# Patient Record
Sex: Male | Born: 1995
Health system: Southern US, Community
[De-identification: ages and names within clinical notes are randomized; demographics above are authoritative.]

---
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
## 2017-03-11 DIAGNOSIS — Q213 Tetralogy of Fallot: Secondary | ICD-10-CM | POA: Diagnosis not present

## 2017-04-26 DIAGNOSIS — Z0000 Encounter for general adult medical examination without abnormal findings: Secondary | ICD-10-CM | POA: Diagnosis not present

## 2017-04-26 DIAGNOSIS — Q210 Ventricular septal defect: Secondary | ICD-10-CM | POA: Diagnosis not present

## 2017-11-03 DIAGNOSIS — J069 Acute upper respiratory infection, unspecified: Secondary | ICD-10-CM | POA: Diagnosis not present

## 2017-11-03 DIAGNOSIS — R21 Rash and other nonspecific skin eruption: Secondary | ICD-10-CM | POA: Diagnosis not present

## 2017-11-03 DIAGNOSIS — R05 Cough: Secondary | ICD-10-CM | POA: Diagnosis not present

## 2018-02-09 DIAGNOSIS — R509 Fever, unspecified: Secondary | ICD-10-CM | POA: Diagnosis not present

## 2018-02-09 DIAGNOSIS — J039 Acute tonsillitis, unspecified: Secondary | ICD-10-CM | POA: Diagnosis not present

## 2018-10-17 DIAGNOSIS — Q213 Tetralogy of Fallot: Secondary | ICD-10-CM | POA: Diagnosis not present

## 2018-12-04 DIAGNOSIS — J101 Influenza due to other identified influenza virus with other respiratory manifestations: Secondary | ICD-10-CM | POA: Diagnosis not present

## 2018-12-04 DIAGNOSIS — R6889 Other general symptoms and signs: Secondary | ICD-10-CM | POA: Diagnosis not present

## 2018-12-04 DIAGNOSIS — Z9889 Other specified postprocedural states: Secondary | ICD-10-CM | POA: Diagnosis not present

## 2018-12-04 DIAGNOSIS — R011 Cardiac murmur, unspecified: Secondary | ICD-10-CM | POA: Diagnosis not present

## 2019-04-01 DIAGNOSIS — Z20828 Contact with and (suspected) exposure to other viral communicable diseases: Secondary | ICD-10-CM | POA: Diagnosis not present

## 2019-12-08 DIAGNOSIS — Z20828 Contact with and (suspected) exposure to other viral communicable diseases: Secondary | ICD-10-CM | POA: Diagnosis not present

## 2020-02-02 DIAGNOSIS — R509 Fever, unspecified: Secondary | ICD-10-CM | POA: Diagnosis not present

## 2020-09-18 DIAGNOSIS — J029 Acute pharyngitis, unspecified: Secondary | ICD-10-CM | POA: Diagnosis not present

## 2020-09-18 DIAGNOSIS — Z20828 Contact with and (suspected) exposure to other viral communicable diseases: Secondary | ICD-10-CM | POA: Diagnosis not present

## 2020-09-18 DIAGNOSIS — Z20822 Contact with and (suspected) exposure to covid-19: Secondary | ICD-10-CM | POA: Diagnosis not present

## 2020-10-27 ENCOUNTER — Other Ambulatory Visit: Payer: BC Managed Care – PPO

## 2020-10-27 DIAGNOSIS — Z20822 Contact with and (suspected) exposure to covid-19: Secondary | ICD-10-CM

## 2020-10-29 LAB — NOVEL CORONAVIRUS, NAA: SARS-CoV-2, NAA: NOT DETECTED

## 2020-10-29 LAB — SARS-COV-2, NAA 2 DAY TAT

## 2021-04-02 DIAGNOSIS — Q210 Ventricular septal defect: Secondary | ICD-10-CM | POA: Diagnosis not present

## 2021-04-02 DIAGNOSIS — R41840 Attention and concentration deficit: Secondary | ICD-10-CM | POA: Diagnosis not present

## 2021-06-07 DIAGNOSIS — Z708 Other sex counseling: Secondary | ICD-10-CM | POA: Diagnosis not present

## 2021-08-14 DIAGNOSIS — R41840 Attention and concentration deficit: Secondary | ICD-10-CM | POA: Diagnosis not present

## 2021-08-15 ENCOUNTER — Emergency Department (HOSPITAL_BASED_OUTPATIENT_CLINIC_OR_DEPARTMENT_OTHER): Payer: BC Managed Care – PPO

## 2021-08-15 ENCOUNTER — Encounter (HOSPITAL_BASED_OUTPATIENT_CLINIC_OR_DEPARTMENT_OTHER): Payer: Self-pay | Admitting: Emergency Medicine

## 2021-08-15 ENCOUNTER — Other Ambulatory Visit: Payer: Self-pay

## 2021-08-15 ENCOUNTER — Emergency Department (HOSPITAL_BASED_OUTPATIENT_CLINIC_OR_DEPARTMENT_OTHER)
Admission: EM | Admit: 2021-08-15 | Discharge: 2021-08-15 | Disposition: A | Discharge status: Home / Self Care | Payer: BC Managed Care – PPO | Attending: Emergency Medicine | Admitting: Emergency Medicine

## 2021-08-15 DIAGNOSIS — W2201XA Walked into wall, initial encounter: Secondary | ICD-10-CM | POA: Insufficient documentation

## 2021-08-15 DIAGNOSIS — S62336A Displaced fracture of neck of fifth metacarpal bone, right hand, initial encounter for closed fracture: Secondary | ICD-10-CM

## 2021-08-15 DIAGNOSIS — M79641 Pain in right hand: Secondary | ICD-10-CM | POA: Diagnosis not present

## 2021-08-15 DIAGNOSIS — S62306A Unspecified fracture of fifth metacarpal bone, right hand, initial encounter for closed fracture: Secondary | ICD-10-CM | POA: Diagnosis not present

## 2021-08-15 DIAGNOSIS — S6991XA Unspecified injury of right wrist, hand and finger(s), initial encounter: Secondary | ICD-10-CM | POA: Diagnosis not present

## 2021-08-15 NOTE — ED Triage Notes (Addendum)
  Patient comes in with R hand pain from punching a wall about 3 hours ago.  Patient has swelling in 4th-5th metacarpal area.  Cap refill less than 3 seconds.  Patient took 1000 mg of tylenol before arrival.  Pain 8/10, soreness in R hand.  Patient states he has had several alcoholic drinks tonight but acting appropriate in triage.

## 2021-08-15 NOTE — ED Provider Notes (Signed)
[name] EMERGENCY DEPT Provider Note   CSN: [id number] Arrival date & time: 08/15/21  0342     History Chief Complaint  Patient presents with   Hand Injury    James Willis is a 25 y.o. male.  The history is provided by the patient and a parent.  Hand Injury He has history of tetralogy of Fallot and comes in after injuring his right hand.  He had been drinking and got angry and punched a wall.  He is complaining of pain in the ulnar aspect of the his right hand.  He denies other injury.  Pain is rated at 8/10.   History reviewed. No pertinent past medical history.  There are no problems to display for this patient.   History reviewed. No pertinent surgical history.     History reviewed. No pertinent family history.     Home Medications Prior to Admission medications   Not on File    Allergies    Patient has no known allergies.  Review of Systems   Review of Systems  All other systems reviewed and are negative.  Physical Exam Updated Vital Signs BP 140/88 (BP Location: Left Arm)   Pulse 89   Temp 98.1 F (36.7 C) (Oral)   Resp 18   Ht 5\' 8"  (1.727 m)   Wt 66.2 kg   SpO2 100%   BMI 22.20 kg/m   Physical Exam Vitals and nursing note reviewed.  25 year old male, resting comfortably and in no acute distress. Vital signs are normal. Oxygen saturation is 100%, which is normal. Head is normocephalic and atraumatic. PERRLA, EOMI. Oropharynx is clear. Neck is nontender and supple without adenopathy or JVD. Back is nontender and there is no CVA tenderness. Lungs are clear without rales, wheezes, or rhonchi. Chest is nontender. Heart has regular rate and rhythm without murmur. Abdomen is soft, flat, nontender. Extremities: There is swelling of the ulnar aspect of the right hand.  There is moderate tenderness to palpation over the region of the right fifth MCP joint.  Distal neurovascular exam is intact with normal sensation, prompt capillary  refill. Skin is warm and dry without rash. Neurologic: Mental status is normal, cranial nerves are intact, moves all extremities equally.  ED Results / Procedures / Treatments    Radiology DG Hand Complete Right  Result Date: 08/15/2021 CLINICAL DATA:  Posttraumatic hand pain EXAM: RIGHT HAND - COMPLETE 3 VIEW COMPARISON:  None. FINDINGS: Acute segmental fracture of the fifth metacarpal shaft and neck with volar impaction. No dislocation. IMPRESSION: Impacted fifth metacarpal fracture. Electronically Signed   By: 08/17/2021 M.D.   On: 08/15/2021 04:36    Procedures .Splint Application  Date/Time: 08/15/2021 6:21 AM Performed by: 08/17/2021, MD Authorized by: Dione Booze, MD   Consent:    Consent obtained:  Verbal   Consent given by:  Patient   Risks discussed:  Pain and swelling   Alternatives discussed:  No treatment Universal protocol:    Procedure explained and questions answered to patient or proxy's satisfaction: yes     Relevant documents present and verified: yes     Test results available: yes     Imaging studies available: yes     Required blood products, implants, devices, and special equipment available: yes     Site/side marked: yes     Immediately prior to procedure a time out was called: yes     Patient identity confirmed:  Verbally with patient and arm band Pre-procedure details:  Distal neurologic exam:  Normal   Distal perfusion: brisk capillary refill   Procedure details:    Location:  Hand   Hand location:  R hand   Strapping: no     Splint type:  Ulnar gutter   Supplies:  Fiberglass and elastic bandage Post-procedure details:    Distal neurologic exam:  Normal   Distal perfusion: brisk capillary refill     Procedure completion:  Tolerated well, no immediate complications   Post-procedure imaging: not applicable     Medications Ordered in ED Medications - No data to display  ED Course  I have reviewed the triage vital signs and the  nursing notes.  Pertinent imaging results that were available during my care of the patient were reviewed by me and considered in my medical decision making (see chart for details).   MDM Rules/Calculators/A&P                         Injury to right hand concerning for boxer's fracture.  X-rays confirm fracture of the distal right fifth metacarpal with volar angulation.  He is placed in an ulnar gutter splint and referred to hand surgery for follow-up.  Advised to take over-the-counter NSAIDs and acetaminophen as needed for pain.  Old records are reviewed, and he has no relevant past visits.  Final Clinical Impression(s) / ED Diagnoses Final diagnoses:  None    Rx / DC Orders ED Discharge Orders     None        Dione Booze, MD 08/15/21 [phone]

## 2021-08-15 NOTE — Discharge Instructions (Addendum)
Apply ice for 30 minutes at a time, 4 times a day.  Keep your hand elevated is much as possible.  Take ibuprofen or naproxen as needed for pain.  For additional pain relief, add acetaminophen.  Combining acetaminophen with either ibuprofen or naproxen gives you better pain relief than either medication by itself.

## 2021-08-18 ENCOUNTER — Other Ambulatory Visit: Payer: Self-pay

## 2021-08-18 ENCOUNTER — Ambulatory Visit: Payer: BC Managed Care – PPO | Admitting: Orthopedic Surgery

## 2021-08-18 DIAGNOSIS — S62336A Displaced fracture of neck of fifth metacarpal bone, right hand, initial encounter for closed fracture: Secondary | ICD-10-CM

## 2021-08-18 DIAGNOSIS — S62338A Displaced fracture of neck of other metacarpal bone, initial encounter for closed fracture: Secondary | ICD-10-CM | POA: Insufficient documentation

## 2021-08-18 NOTE — Progress Notes (Signed)
Office Visit Note   Patient: James Willis           Date of Birth: 07/04/1996           MRN: [id number] Visit Date: 08/18/2021              Requested by: No referring provider defined for this encounter. PCP: Pcp, No   Assessment & Plan: Visit Diagnoses:  1. Closed displaced fracture of neck of fifth metacarpal bone of right hand, initial encounter     Plan: We discussed the diagnosis, prognosis, non-operative and operative treatment options for metacarpal neck fractures.  Given that he has no pseudoclawing and is able to make a complete fist without any clinical evidence of malrotation, he is a candidate for non-operative treatment with immobilization.   After our discussion, the patient would like to proceed with immobilization.  We reviewed the risks and benefits of conservative management.  The patient expressed understanding of the reasoning and strategy going forward.  All patient questions and concerns were addressed.    Follow-Up Instructions: No follow-ups on file.   Orders:  No orders of the defined types were placed in this encounter.  No orders of the defined types were placed in this encounter.     Procedures: Casting  Date/Time: 08/18/2021 11:54 AM Performed by: Marlyne Beards, MD Authorized by: Marlyne Beards, MD   Consent Given by:  Patient Location:  Hand  right hand Fracture Type: fifth metacarpal   Neurovascularly intact   Distal Perfusion: normal   Distal Sensation: normal   Manipulation Performed?: No   Immobilization:  Cast Is this the patient's first cast for this injury?: Yes   Cast Type:  Ulnar gutter Supplies Used:  Fiberglass and cotton padding Neurovascularly intact   Distal Perfusion: normal   Distal Sensation: normal   Patient tolerance:  Patient tolerated the procedure well with no immediate complications   Clinical Data: No additional findings.   Subjective: Chief Complaint  Patient presents with   Right Little  Finger - Follow-up, Fracture    DOI: 08/14/21, RIGHT handed, +swelling, no pain right now, states that he punched a wall.    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  He punched a wall over the weekend and went to the ER.  He was placed in a ulnar gutter splint.  He has 0 out of 10 pain today.  With the splint off, he has mild to moderate swelling over the fifth metacarpal neck.  He is able to make a complete fist without any evidence of malrotation.  He has no pseudoclawing and is able to fully extend at the MP joint.  He denies elsewhere in the hand.   Review of Systems   Objective: Vital Signs: There were no vitals taken for this visit.  Physical Exam Constitutional:      Appearance: Normal appearance.  Cardiovascular:     Rate and Rhythm: Normal rate.     Pulses: Normal pulses.  Pulmonary:     Effort: Pulmonary effort is normal.  Skin:    General: Skin is warm and dry.     Capillary Refill: Capillary refill takes less than 2 seconds.  Neurological:     Mental Status: He is alert.    Right Hand Exam   Tenderness  Right hand tenderness location: Mildly TTP at 5th MC head.  Range of Motion  The patient has normal right  wrist ROM.   Muscle Strength  The patient has normal right wrist strength.  Other  Erythema: absent Sensation: normal Pulse: present  Comments:  No malrotation of the small finger when making a complete fist.  No pseudoclawing or extensor lag.      Specialty Comments:  No specialty comments available.  Imaging: 3 views of the right hand from the ER are reviewed by me.  They demonstrate a segmental fracture of the fifth metacarpal neck with mild coronal displacement, no articular involvement, and apex dorsal angulation of about 50 degrees.   PMFS History: Patient Active Problem List   Diagnosis Date Noted   Closed displaced fracture of neck  of fifth metacarpal bone 08/18/2021   No past medical history on file.  No family history on file.  No past surgical history on file. Social History   Occupational History   Not on file  Tobacco Use   Smoking status: Not on file   Smokeless tobacco: Not on file  Substance and Sexual Activity   Alcohol use: Not on file   Drug use: Not on file   Sexual activity: Not on file

## 2021-09-15 ENCOUNTER — Ambulatory Visit: Payer: BC Managed Care – PPO | Admitting: Orthopedic Surgery

## 2021-09-15 ENCOUNTER — Ambulatory Visit (INDEPENDENT_AMBULATORY_CARE_PROVIDER_SITE_OTHER): Payer: BC Managed Care – PPO

## 2021-09-15 ENCOUNTER — Other Ambulatory Visit: Payer: Self-pay

## 2021-09-15 DIAGNOSIS — S62336A Displaced fracture of neck of fifth metacarpal bone, right hand, initial encounter for closed fracture: Secondary | ICD-10-CM

## 2021-09-15 NOTE — Progress Notes (Signed)
Office Visit Note   Patient: James Willis           Date of Birth: 12/18/1995           MRN: [id number] Visit Date: 09/15/2021              Requested by: No referring provider defined for this encounter. PCP: Pcp, No   Assessment & Plan: Visit Diagnoses:  1. Closed displaced fracture of neck of fifth metacarpal bone of right hand, initial encounter     Plan: His cast was removed today.  He has no pain at the 5th [facility] head/neck.  He has normal post immobilization stiffness but no pseudoclawing or malrotation.  He has a slight bony prominence dorsally but this doesn't bother him.  He can wear some buddy straps for an additional two weeks for support but does not need any additional follow up.   Follow-Up Instructions: No follow-ups on file.   Orders:  Orders Placed This Encounter  Procedures   XR Hand Complete Right   No orders of the defined types were placed in this encounter.     Procedures: No procedures performed   Clinical Data: No additional findings.   Subjective: Chief Complaint  Patient presents with   Right Hand - Follow-up, Fracture    Of 5th Metacarpal, he says it feels good just stiff    This is a 25 yo RHD M who presents for follow up of a closed R 5th MC neck fracture after punching a wall.  At our initial visit, we discussed operative versus nonoperative treatment.  After our discussion, he opted for nonoperative treatment in a cast.  He is now 4 weeks out from injury.  He has no pain at the fracture site.  He is able to make a near complete fist without malrotation.     Review of Systems   Objective: Vital Signs: There were no vitals taken for this visit.  Physical Exam Constitutional:      Appearance: Normal appearance.  Cardiovascular:     Rate and Rhythm: Normal rate.     Pulses: Normal pulses.  Pulmonary:     Effort: Pulmonary effort is normal.  Skin:    General: Skin is warm and dry.     Capillary Refill: Capillary refill takes  less than 2 seconds.  Neurological:     Mental Status: He is alert.    Right Hand Exam   Tenderness  The patient is experiencing no tenderness.   Range of Motion  The patient has normal right wrist ROM.   Other  Sensation: normal Pulse: present  Comments:  No TTP at fracture site.  Bony prominence dorsally.  Near full ROM of small finger without malrotation or pseudoclawing.      Specialty Comments:  No specialty comments available.  Imaging: 3V of the right hand taken today are reviewed and interpreted by me.  They demonstrate some interval healing of the fracture with unchanged alignment.    PMFS History: Patient Active Problem List   Diagnosis Date Noted   Closed displaced fracture of neck of fifth metacarpal bone 08/18/2021   No past medical history on file.  No family history on file.  No past surgical history on file. Social History   Occupational History   Not on file  Tobacco Use   Smoking status: Not on file   Smokeless tobacco: Not on file  Substance and Sexual Activity   Alcohol use: Not on file   Drug  use: Not on file   Sexual activity: Not on file

## 2022-02-15 DIAGNOSIS — Z79899 Other long term (current) drug therapy: Secondary | ICD-10-CM | POA: Diagnosis not present

## 2022-02-15 DIAGNOSIS — Z6822 Body mass index (BMI) 22.0-22.9, adult: Secondary | ICD-10-CM | POA: Diagnosis not present

## 2022-02-15 DIAGNOSIS — Q213 Tetralogy of Fallot: Secondary | ICD-10-CM | POA: Diagnosis not present

## 2022-03-11 DIAGNOSIS — Q213 Tetralogy of Fallot: Secondary | ICD-10-CM | POA: Diagnosis not present

## 2022-03-25 DIAGNOSIS — Q213 Tetralogy of Fallot: Secondary | ICD-10-CM | POA: Diagnosis not present

## 2022-05-09 IMAGING — DX DG HAND COMPLETE 3+V*R*
1 series · 3 of 3 positions shown · non-contrast
Comparison: None.

CLINICAL DATA: Posttraumatic hand pain

EXAM:
RIGHT HAND - COMPLETE 3 VIEW

[Series 1: hand · 0.14mm/px · 3 of 3 slices shown]
[im 1/3]
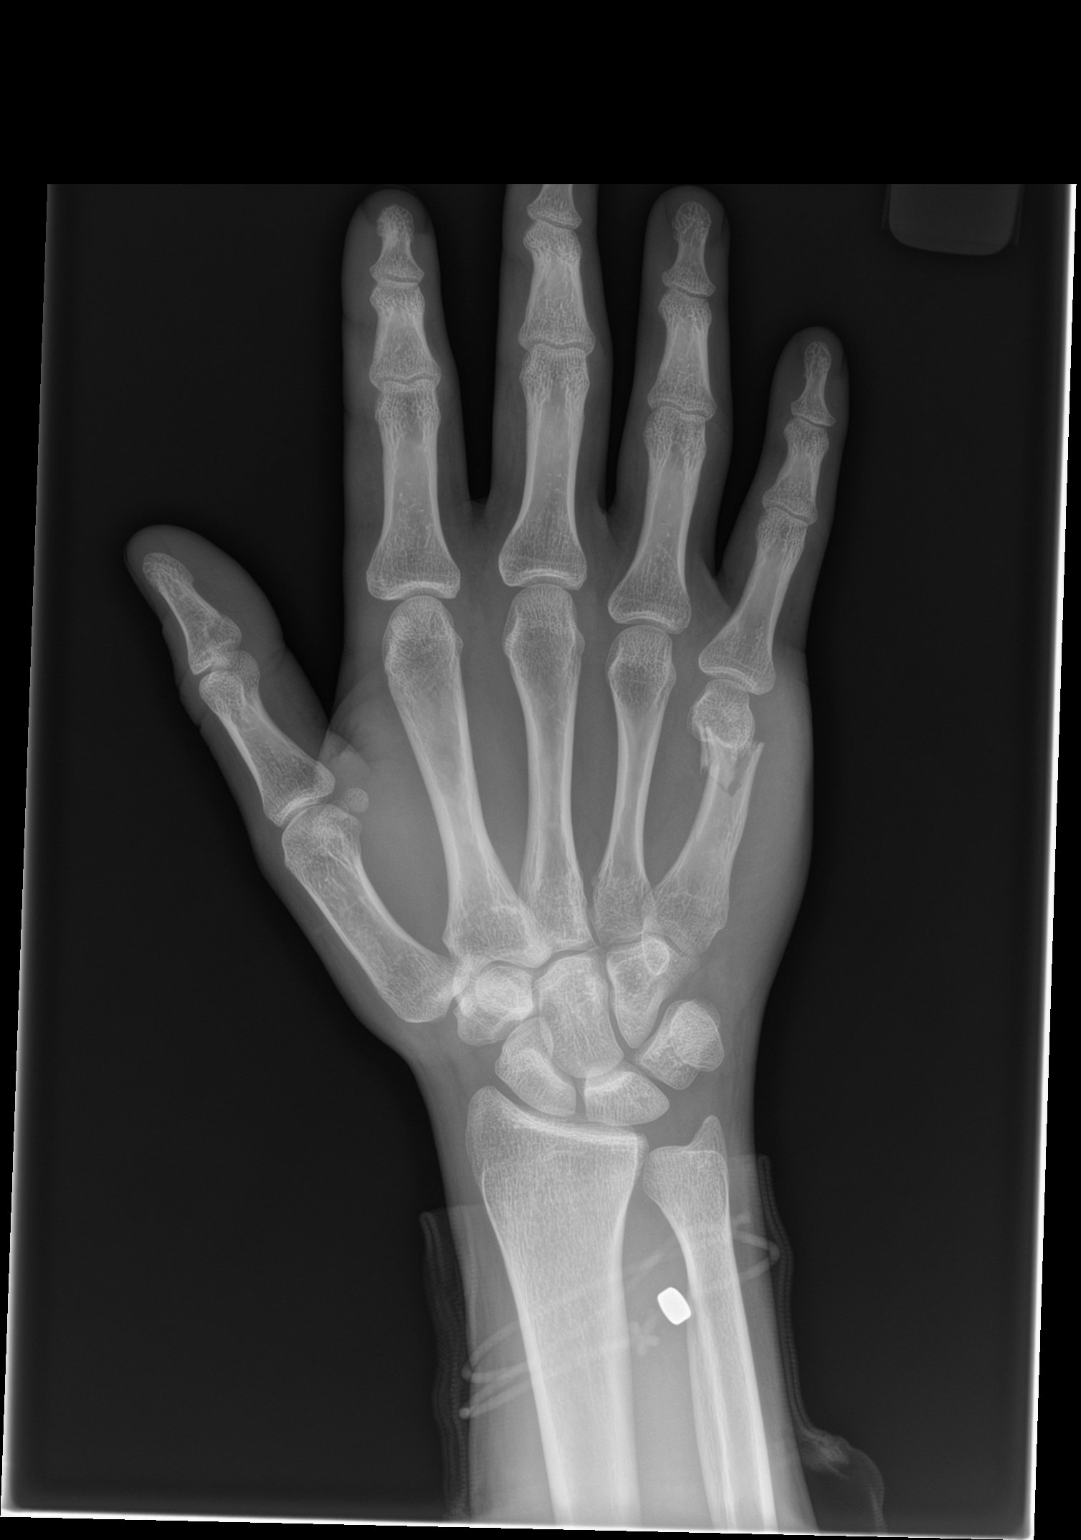
[im 2/3]
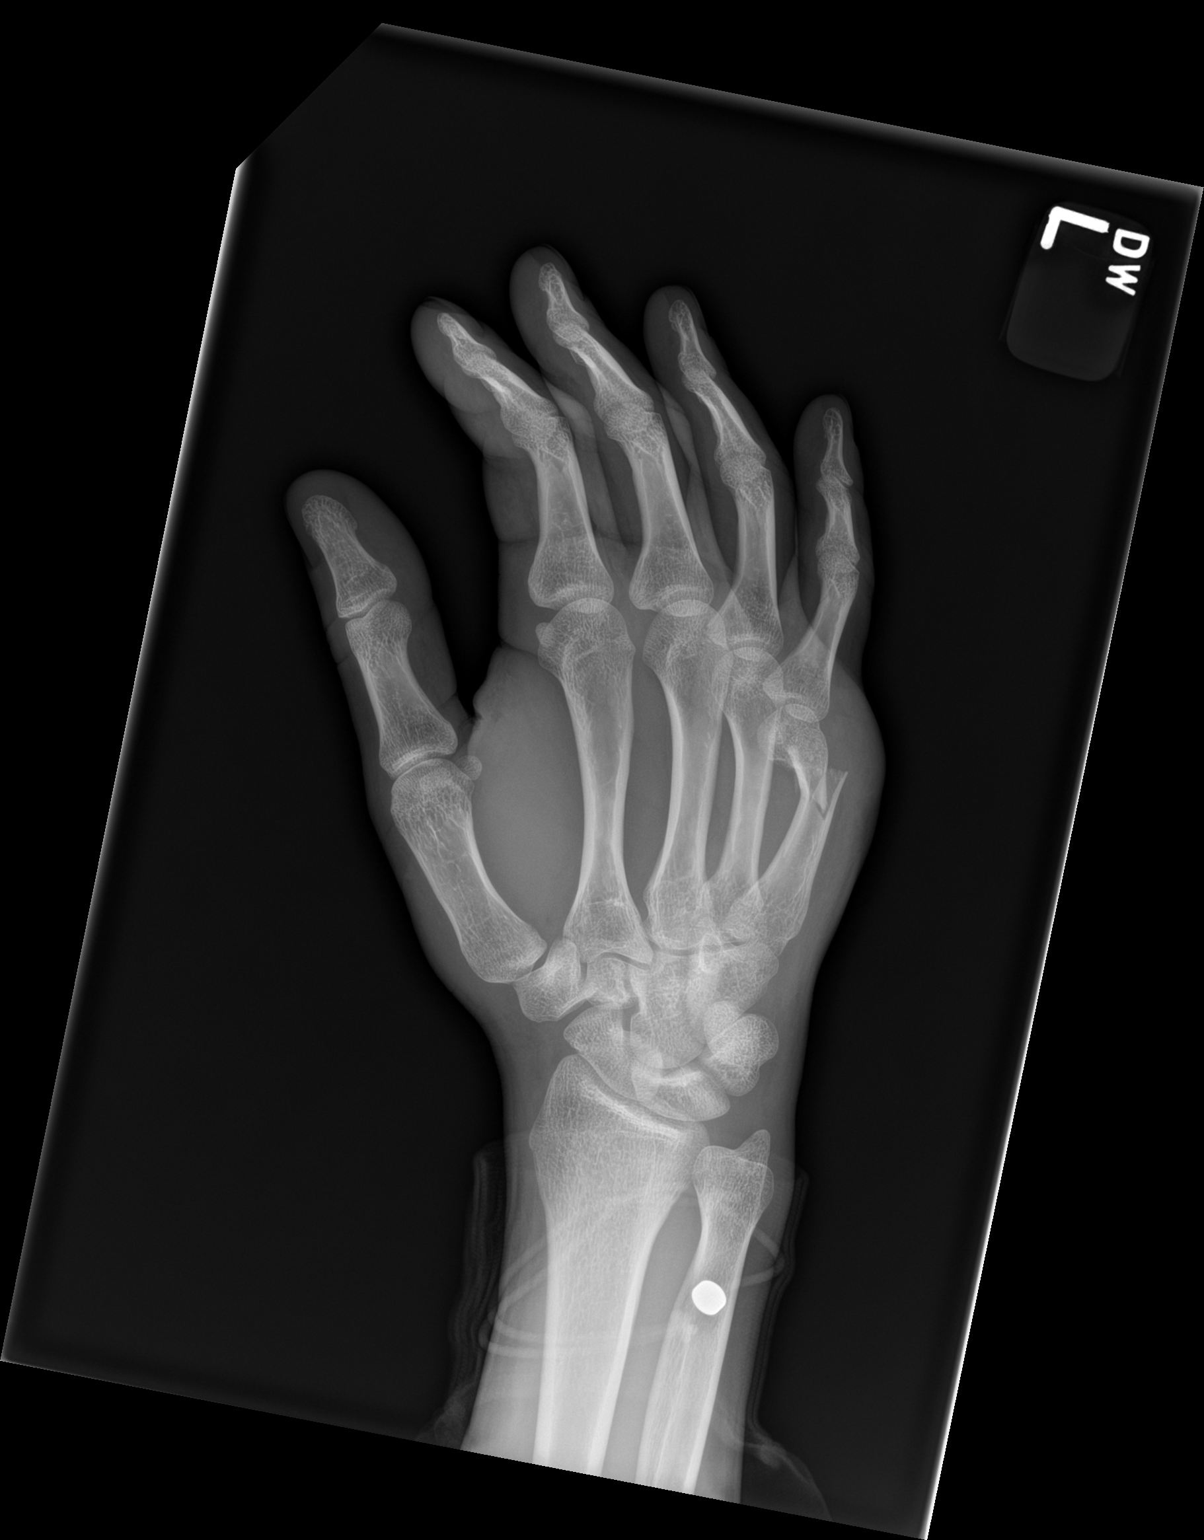
[im 3/3]
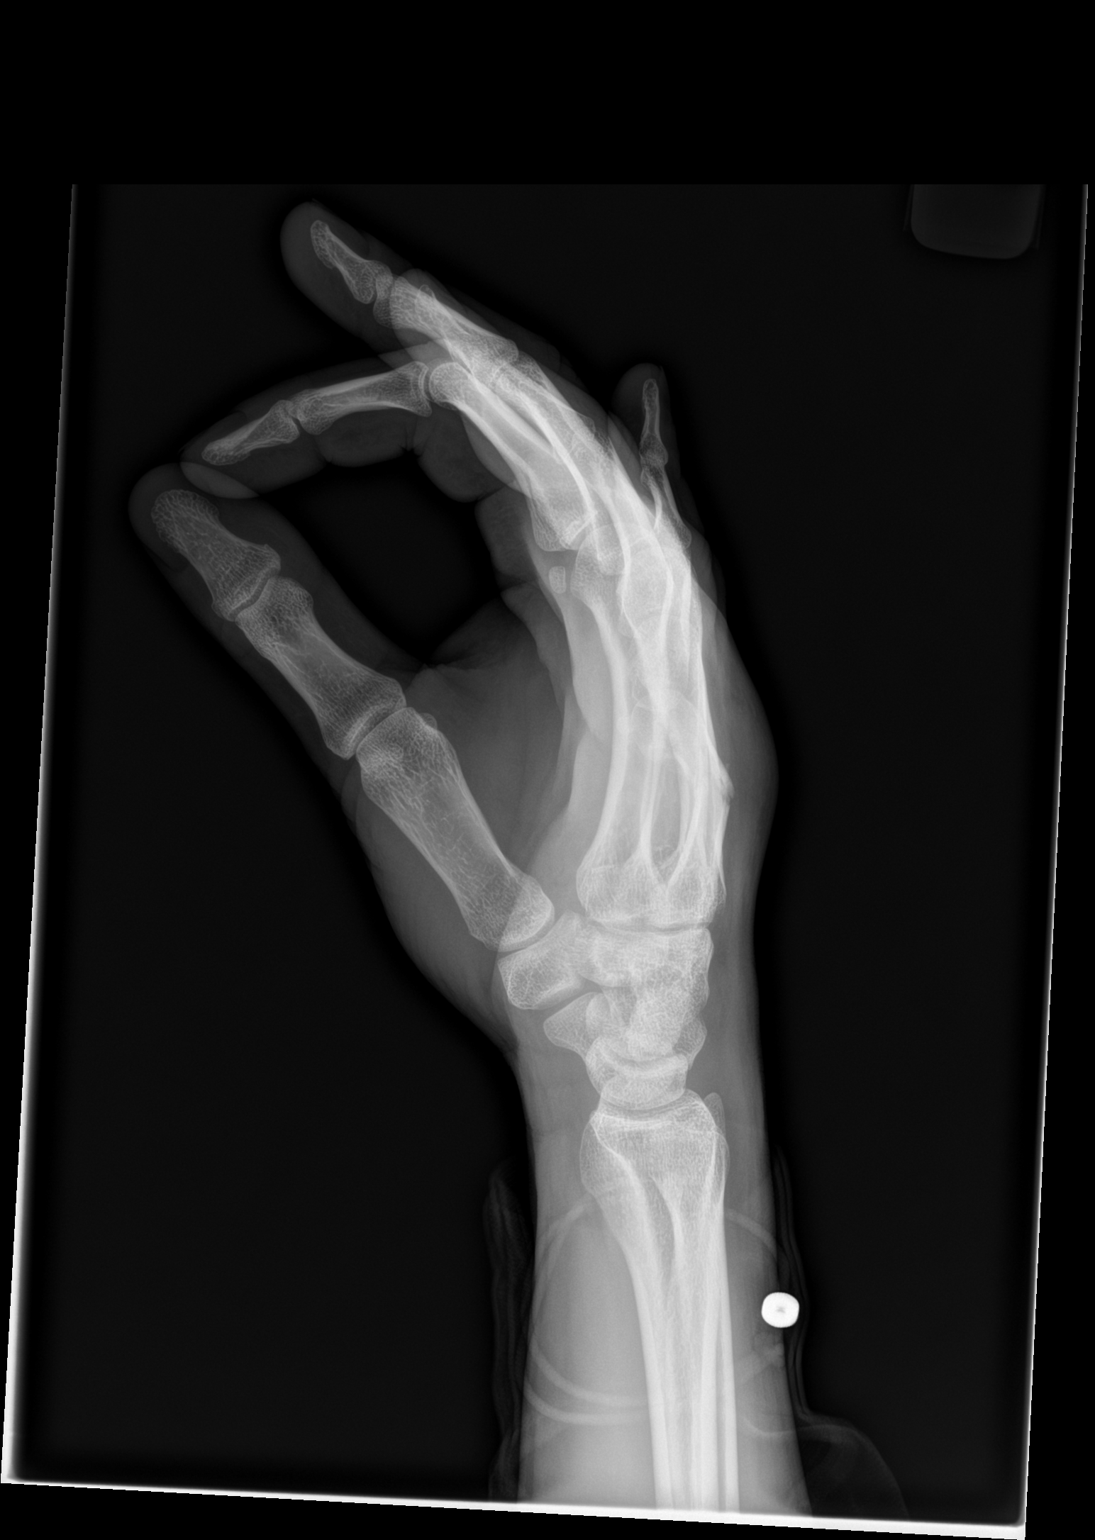

[3 of 3 positions shown; findings below may reference images not displayed]

FINDINGS: Acute segmental fracture of the fifth metacarpal shaft and neck with
volar impaction. No dislocation.
IMPRESSION: Impacted fifth metacarpal fracture.

## 2022-11-12 DIAGNOSIS — J069 Acute upper respiratory infection, unspecified: Secondary | ICD-10-CM | POA: Diagnosis not present

## 2022-11-15 DIAGNOSIS — J101 Influenza due to other identified influenza virus with other respiratory manifestations: Secondary | ICD-10-CM | POA: Diagnosis not present

## 2023-02-17 DIAGNOSIS — J028 Acute pharyngitis due to other specified organisms: Secondary | ICD-10-CM | POA: Diagnosis not present

## 2023-02-17 DIAGNOSIS — Z20822 Contact with and (suspected) exposure to covid-19: Secondary | ICD-10-CM | POA: Diagnosis not present

## 2023-02-17 DIAGNOSIS — R509 Fever, unspecified: Secondary | ICD-10-CM | POA: Diagnosis not present

## 2023-06-30 DIAGNOSIS — J029 Acute pharyngitis, unspecified: Secondary | ICD-10-CM | POA: Diagnosis not present

## 2023-06-30 DIAGNOSIS — R590 Localized enlarged lymph nodes: Secondary | ICD-10-CM | POA: Diagnosis not present
